# Patient Record
Sex: Female | Born: 1935 | Race: Black or African American | Hispanic: No | Smoking: Never smoker
Health system: Southern US, Community
[De-identification: ages and names within clinical notes are randomized; demographics above are authoritative.]

## PROBLEM LIST (undated history)

## (undated) DIAGNOSIS — E119 Type 2 diabetes mellitus without complications: Secondary | ICD-10-CM

## (undated) DIAGNOSIS — K219 Gastro-esophageal reflux disease without esophagitis: Secondary | ICD-10-CM

## (undated) DIAGNOSIS — C801 Malignant (primary) neoplasm, unspecified: Secondary | ICD-10-CM

## (undated) DIAGNOSIS — Z86718 Personal history of other venous thrombosis and embolism: Secondary | ICD-10-CM

## (undated) DIAGNOSIS — I10 Essential (primary) hypertension: Secondary | ICD-10-CM

## (undated) DIAGNOSIS — M4800 Spinal stenosis, site unspecified: Secondary | ICD-10-CM

## (undated) DIAGNOSIS — K5900 Constipation, unspecified: Secondary | ICD-10-CM

## (undated) HISTORY — PX: ABDOMINAL HYSTERECTOMY: SHX81

---

## 2014-11-25 ENCOUNTER — Encounter (HOSPITAL_COMMUNITY): Payer: Self-pay | Admitting: Emergency Medicine

## 2014-11-25 ENCOUNTER — Emergency Department (HOSPITAL_COMMUNITY)
Admission: EM | Admit: 2014-11-25 | Discharge: 2014-11-26 | Disposition: A | Discharge status: Home / Self Care | Payer: Medicare Other | Attending: Emergency Medicine | Admitting: Emergency Medicine

## 2014-11-25 DIAGNOSIS — Z8719 Personal history of other diseases of the digestive system: Secondary | ICD-10-CM | POA: Insufficient documentation

## 2014-11-25 DIAGNOSIS — Z880 Allergy status to penicillin: Secondary | ICD-10-CM | POA: Diagnosis not present

## 2014-11-25 DIAGNOSIS — Z8739 Personal history of other diseases of the musculoskeletal system and connective tissue: Secondary | ICD-10-CM | POA: Insufficient documentation

## 2014-11-25 DIAGNOSIS — E119 Type 2 diabetes mellitus without complications: Secondary | ICD-10-CM | POA: Insufficient documentation

## 2014-11-25 DIAGNOSIS — Z8542 Personal history of malignant neoplasm of other parts of uterus: Secondary | ICD-10-CM | POA: Insufficient documentation

## 2014-11-25 DIAGNOSIS — Z86718 Personal history of other venous thrombosis and embolism: Secondary | ICD-10-CM | POA: Diagnosis not present

## 2014-11-25 DIAGNOSIS — R040 Epistaxis: Secondary | ICD-10-CM

## 2014-11-25 DIAGNOSIS — I10 Essential (primary) hypertension: Secondary | ICD-10-CM | POA: Diagnosis not present

## 2014-11-25 DIAGNOSIS — R042 Hemoptysis: Secondary | ICD-10-CM | POA: Diagnosis present

## 2014-11-25 HISTORY — DX: Type 2 diabetes mellitus without complications: E11.9

## 2014-11-25 HISTORY — DX: Essential (primary) hypertension: I10

## 2014-11-25 HISTORY — DX: Personal history of other venous thrombosis and embolism: Z86.718

## 2014-11-25 HISTORY — DX: Constipation, unspecified: K59.00

## 2014-11-25 HISTORY — DX: Spinal stenosis, site unspecified: M48.00

## 2014-11-25 HISTORY — DX: Malignant (primary) neoplasm, unspecified: C80.1

## 2014-11-25 HISTORY — DX: Gastro-esophageal reflux disease without esophagitis: K21.9

## 2014-11-25 LAB — CBG MONITORING, ED: Glucose-Capillary: 144 mg/dL — ABNORMAL HIGH (ref 65–99)

## 2014-11-25 NOTE — ED Notes (Signed)
Bed: [id number] Expected date:  Expected time:  Means of arrival:  Comments: EMS 81 yo female from SNF, found patient in room at 2230 coughing up blood, 25 cc's

## 2014-11-25 NOTE — ED Notes (Addendum)
Per EMS, patient from [facility], patient had an episode of coughing up blood per facility staff at 2230. Estimated amount 12ml. Described as clear and red in color with no clots. Patient has no specific complaints. Patient has a DNR. Patient takes 15mg  Xarelto daily.

## 2014-11-26 ENCOUNTER — Emergency Department (HOSPITAL_COMMUNITY): Payer: Medicare Other

## 2014-11-26 ENCOUNTER — Encounter (HOSPITAL_COMMUNITY): Payer: Self-pay

## 2014-11-26 LAB — CBC WITH DIFFERENTIAL/PLATELET
BASOS ABS: 0 10*3/uL (ref 0.0–0.1)
Basophils Relative: 0 %
Eosinophils Absolute: 0.2 10*3/uL (ref 0.0–0.7)
Eosinophils Relative: 3 %
HEMATOCRIT: 31.4 % — AB (ref 36.0–46.0)
HEMOGLOBIN: 10.5 g/dL — AB (ref 12.0–15.0)
LYMPHS ABS: 2.8 10*3/uL (ref 0.7–4.0)
LYMPHS PCT: 32 %
MCH: 32.6 pg (ref 26.0–34.0)
MCHC: 33.4 g/dL (ref 30.0–36.0)
MCV: 97.5 fL (ref 78.0–100.0)
Monocytes Absolute: 0.8 10*3/uL (ref 0.1–1.0)
Monocytes Relative: 9 %
NEUTROS ABS: 4.8 10*3/uL (ref 1.7–7.7)
Neutrophils Relative %: 56 %
Platelets: 208 10*3/uL (ref 150–400)
RBC: 3.22 MIL/uL — AB (ref 3.87–5.11)
RDW: 13.7 % (ref 11.5–15.5)
WBC: 8.7 10*3/uL (ref 4.0–10.5)

## 2014-11-26 LAB — I-STAT CHEM 8, ED
BUN: 18 mg/dL (ref 6–20)
CHLORIDE: 106 mmol/L (ref 101–111)
CREATININE: 0.8 mg/dL (ref 0.44–1.00)
Calcium, Ion: 1.3 mmol/L (ref 1.13–1.30)
GLUCOSE: 147 mg/dL — AB (ref 65–99)
HCT: 33 % — ABNORMAL LOW (ref 36.0–46.0)
Hemoglobin: 11.2 g/dL — ABNORMAL LOW (ref 12.0–15.0)
POTASSIUM: 4.1 mmol/L (ref 3.5–5.1)
Sodium: 143 mmol/L (ref 135–145)
TCO2: 24 mmol/L (ref 0–100)

## 2014-11-26 LAB — PROTIME-INR
INR: 1.88 — ABNORMAL HIGH (ref 0.00–1.49)
Prothrombin Time: 21.5 seconds — ABNORMAL HIGH (ref 11.6–15.2)

## 2014-11-26 MED ORDER — IOHEXOL 350 MG/ML SOLN
100.0000 mL | Freq: Once | INTRAVENOUS | Status: AC | PRN
  Administered 2014-11-26: 100 mL via INTRAVENOUS

## 2014-11-26 NOTE — ED Notes (Signed)
IV attempt x2 unsuccessful. Requested 2nd nurse to assist with placement.

## 2014-11-26 NOTE — Discharge Instructions (Signed)

## 2014-11-26 NOTE — ED Provider Notes (Signed)
CSN: [id number]     Arrival date & time 11/25/14  2333 History   By signing my name below, I, Leslie Maldonado, attest that this documentation has been prepared under the direction and in the presence of Leslie Pickering, MD.  Electronically Signed: Forrestine Maldonado, ED Scribe. 11/26/2014. 1:36 AM.   Chief Complaint  Patient presents with  . Hemoptysis   The history is provided by the patient. No language interpreter was used.    HPI Comments: Leslie Maldonado brought in by EMS from [facility] and Leslie Maldonado is a 81 y.o. female with a PMHx of DM, HTN, and cancer who presents to the Emergency Department here after an episode of hemoptysis at approximately 2230. Estimated amount 25 ml described as clear and red in color without any clots. No previous history of same. She denies any pain at this time. No recent fever, chills, nausea, vomiting, abdominal pain, chest pain, or shortness of breath. Leslie Maldonado takes 15 mg of Xarelto daily. Pt is DNR.  PCP: No primary care provider on file.    Past Medical History  Diagnosis Date  . Spinal stenosis   . Diabetes mellitus without complication ([place])   . Hypertension   . History of DVT (deep vein thrombosis)   . Constipation   . GERD (gastroesophageal reflux disease)   . Cancer [facility])     endometrial   Past Surgical History  Procedure Laterality Date  . Abdominal hysterectomy     No family history on file. Social History  Substance Use Topics  . Smoking status: Never Smoker   . Smokeless tobacco: None  . Alcohol Use: No   OB History    No data available     Review of Systems  Constitutional: Negative for fever and chills.  Respiratory: Positive for cough (coughing up blood). Negative for shortness of breath.   Cardiovascular: Negative for chest pain.  Gastrointestinal: Negative for nausea, vomiting and abdominal pain.  Genitourinary: Negative for dysuria.  Musculoskeletal: Negative for back pain.  Skin: Negative for rash.  Neurological:  Negative for numbness and headaches.  Psychiatric/Behavioral: Negative for confusion.  All other systems reviewed and are negative.     Allergies  Other; Penicillins; and Sulfa antibiotics  Home Medications   Prior to Admission medications   Not on File   Triage Vitals: BP 151/68 mmHg  Pulse 67  Temp(Src) 98.2 F (36.8 C) (Oral)  Resp 16  SpO2 97%   Physical Exam  Constitutional: She is oriented to person, place, and time. She appears well-developed and well-nourished. No distress.  HENT:  Head: Normocephalic and atraumatic.  Mouth/Throat: Oropharynx is clear and moist. No oropharyngeal exudate.  Bloody crusting at the top of the oropharynx   Eyes: EOM are normal. Pupils are equal, round, and reactive to light.  Neck: Normal range of motion.  Cardiovascular: Normal rate, regular rhythm and normal heart sounds.   No murmur heard. Pulmonary/Chest: Effort normal and breath sounds normal. No respiratory distress. She has no wheezes. She has no rales. She exhibits no tenderness.  Abdominal: Soft. Bowel sounds are normal. She exhibits no distension. There is no tenderness.  Musculoskeletal: Normal range of motion.  Neurological: She is alert and oriented to person, place, and time. She has normal reflexes.  Skin: Skin is warm and dry.  Psychiatric: She has a normal mood and affect. Judgment normal.  Nursing note and vitals reviewed.   ED Course  Procedures (including critical care time)  DIAGNOSTIC STUDIES: Oxygen Saturation is  97% on RA, adequate by my interpretation.    COORDINATION OF CARE: 1:29 AM- Will order CT angio chest PE with CM and OR without CM, CXR, CBC, i-stat chem 8, PT-INR, and CBG. Discussed treatment plan with pt at bedside and pt agreed to plan.     Labs Review Labs Reviewed  CBC WITH DIFFERENTIAL/PLATELET - Abnormal; Notable for the following:    RBC 3.22 (*)    Hemoglobin 10.5 (*)    HCT 31.4 (*)    All other components within normal limits   PROTIME-INR - Abnormal; Notable for the following:    Prothrombin Time 21.5 (*)    INR 1.88 (*)    All other components within normal limits  CBG MONITORING, ED - Abnormal; Notable for the following:    Glucose-Capillary 144 (*)    All other components within normal limits  I-STAT CHEM 8, ED - Abnormal; Notable for the following:    Glucose, Bld 147 (*)    Hemoglobin 11.2 (*)    HCT 33.0 (*)    All other components within normal limits    Imaging Review Dg Chest 2 View  11/26/2014  CLINICAL DATA:  Coughing up blood this afternoon. No chest complaints. EXAM: CHEST  2 VIEW COMPARISON:  None. FINDINGS: Shallow inspiration. Normal heart size and pulmonary vascularity. No focal airspace disease or consolidation in the lungs. No blunting of costophrenic angles. No pneumothorax. Mediastinal contours appear intact. Prominent degenerative changes in both shoulders with probable chronic dislocations. Degenerative changes in the spine. Postoperative changes in the lower cervical spine. IMPRESSION: No active cardiopulmonary disease. Electronically Signed   By: Lucienne Capers M.D.   On: 11/26/2014 00:35   I have personally reviewed and evaluated these images and lab results as part of my medical decision-making.   EKG Interpretation None      MDM   Final diagnoses:  None   Based on the location of the crusting.  I suspect patient had a posterior nosebleed that resolved.  No PE or hemorrhage on CT no further bleeding.  Safe for discharge.  Strict return precautions  I personally performed the services described in this documentation, which was scribed in my presence. The recorded information has been reviewed and is accurate.     Leslie Kells, MD 11/26/14 [phone]

## 2014-11-26 NOTE — ED Notes (Signed)
Patient transported to CT 

## 2014-11-26 NOTE — ED Notes (Signed)
Attempted to call report to [facility], no answer.

## 2014-11-26 NOTE — ED Notes (Signed)
Patient returned from CT

## 2014-11-26 NOTE — ED Notes (Signed)
[facility] called for discharge transport 

## 2016-11-05 IMAGING — CT CT ANGIO CHEST
2 of 6 series · 18 of 36 positions shown · IV contrast (omnipaque)
Comparison: Chest radiograph performed earlier today at [DATE] a.m.

CLINICAL DATA: Acute onset of hemoptysis.  Initial encounter.

EXAM:
CT ANGIOGRAPHY CHEST WITH CONTRAST
TECHNIQUE: Multidetector CT imaging of the chest was performed using the
standard protocol during bolus administration of intravenous
contrast. Multiplanar CT image reconstructions and MIPs were
obtained to evaluate the vascular anatomy.
CONTRAST:  100mL OMNIPAQUE IOHEXOL 350 MG/ML SOLN

[Series 6: thins for pacs · axial · 0.62mm/px · z∈[+1392,+1564]mm · 17 of 192 slices shown]
[im 10/192  lung]
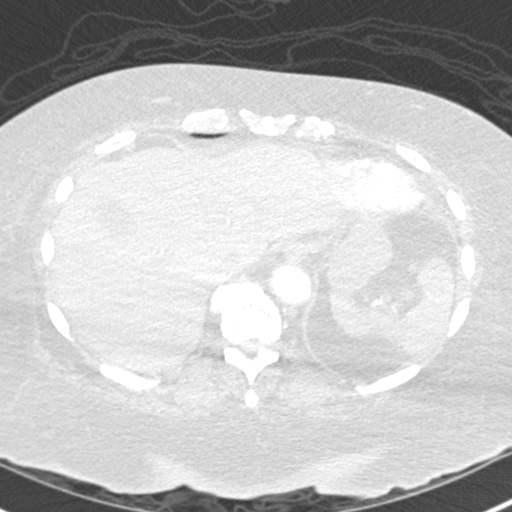
[im 20/192  mediastinal]
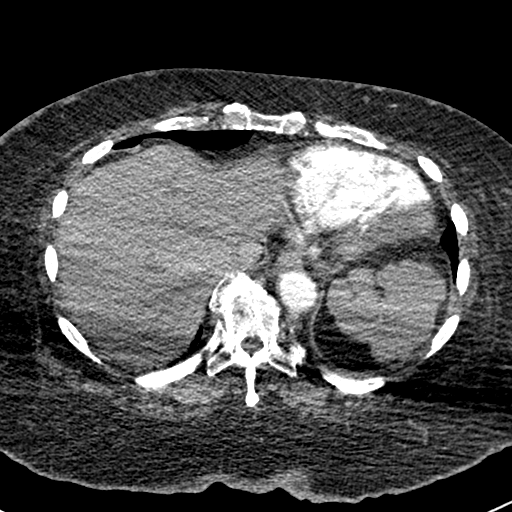
[im 29/192  lung]
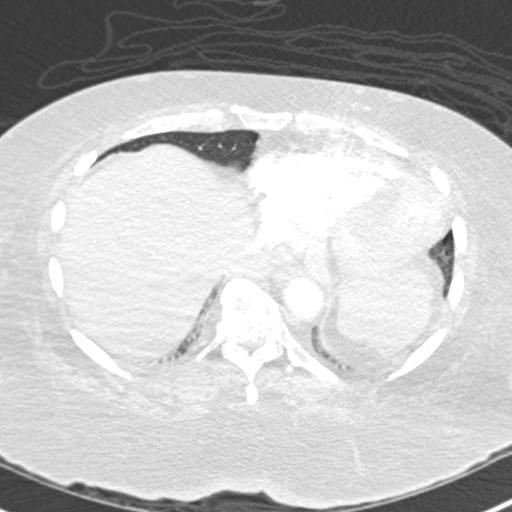
[im 39/192  mediastinal]
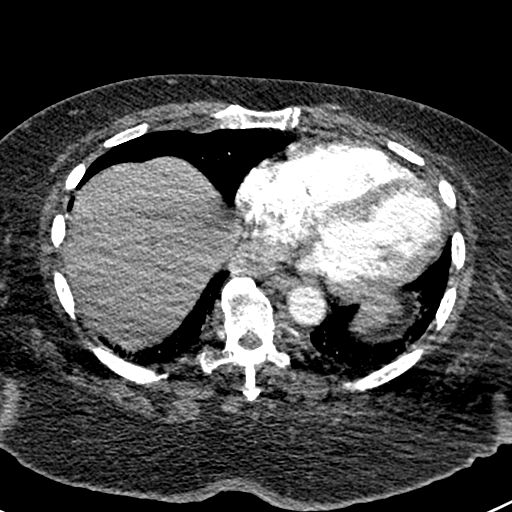
[im 58/192  lung]
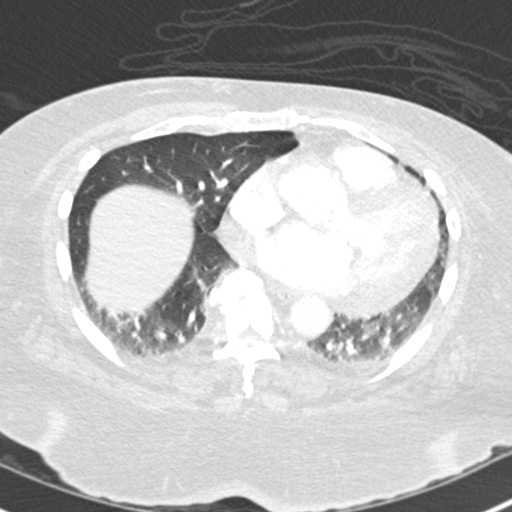
[im 67/192  mediastinal]
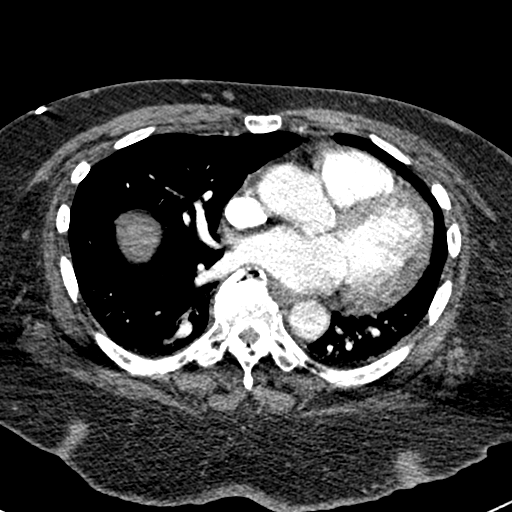
[im 77/192  lung]
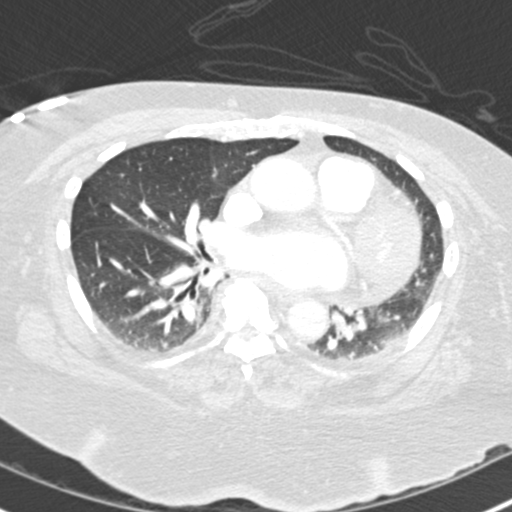
[im 86/192  mediastinal]
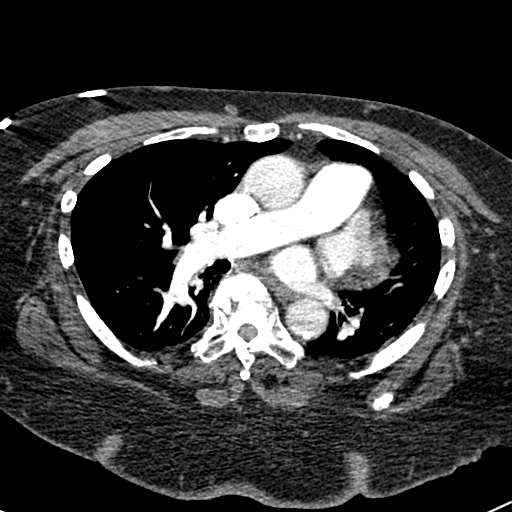
[im 96/192  lung]
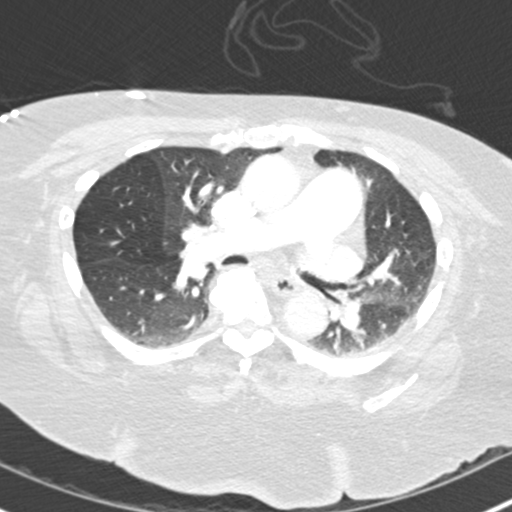
[im 106/192  mediastinal]
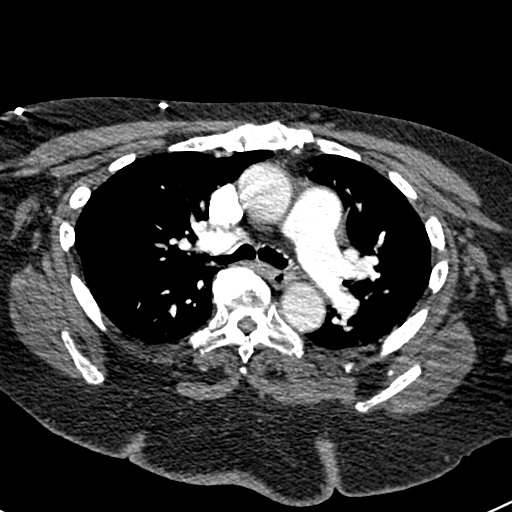
[im 115/192  lung]
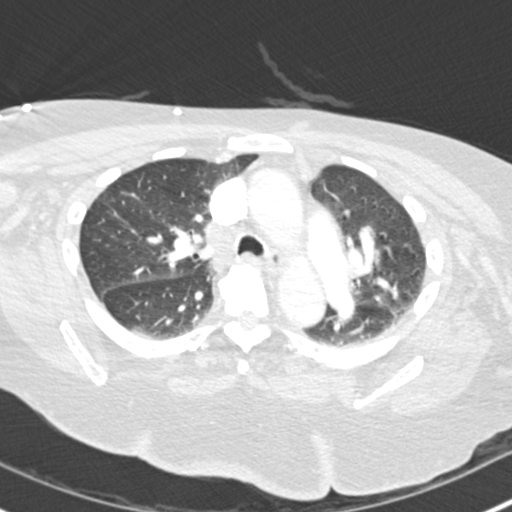
[im 125/192  mediastinal]
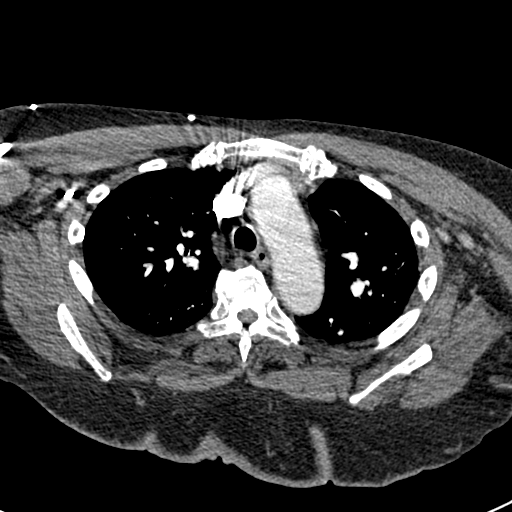
[im 134/192  lung]
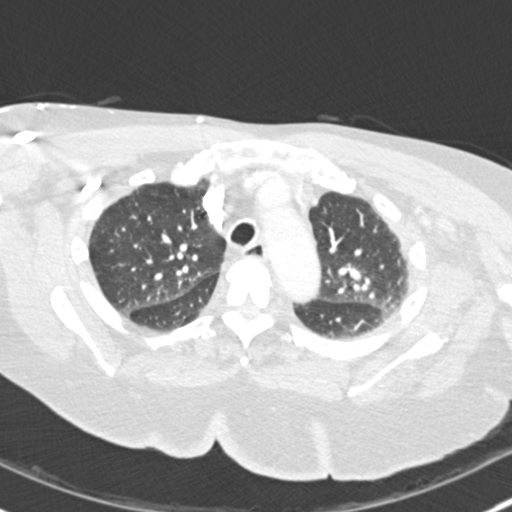
[im 153/192  mediastinal]
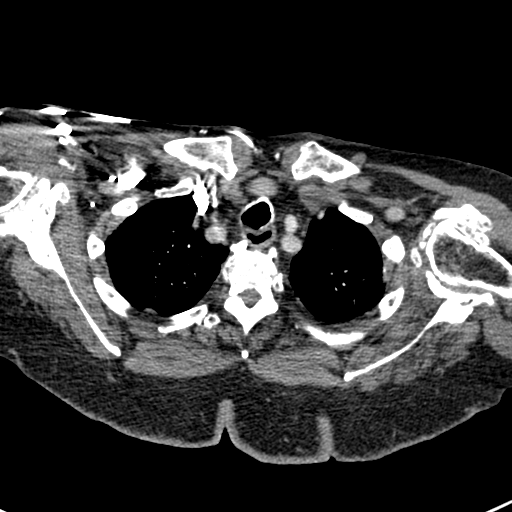
[im 163/192  lung]
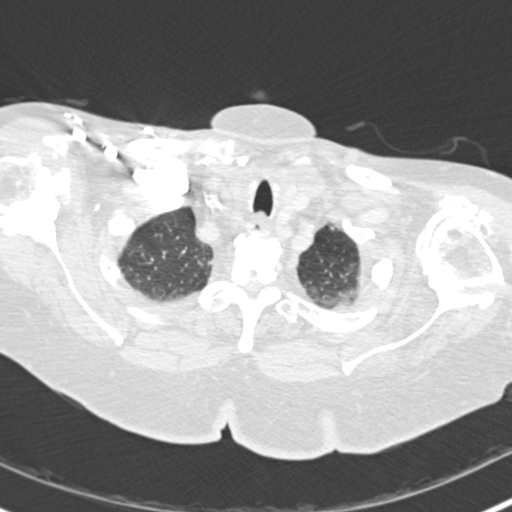
[im 172/192  mediastinal]
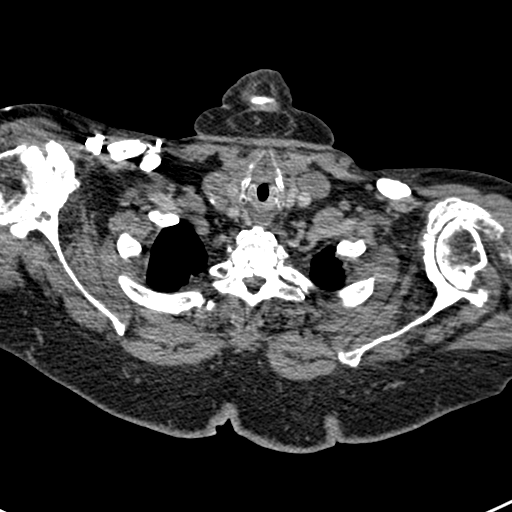
[im 182/192  lung]
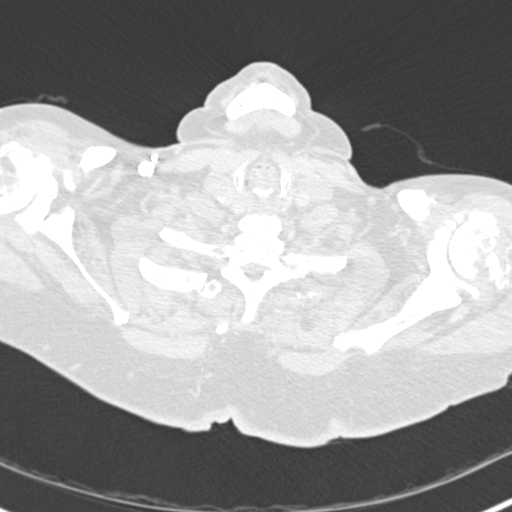

[Series 8: coronal mpr · coronal · 0.37mm/px · 1 of 108 slices shown]
[im 54/108  mediastinal]
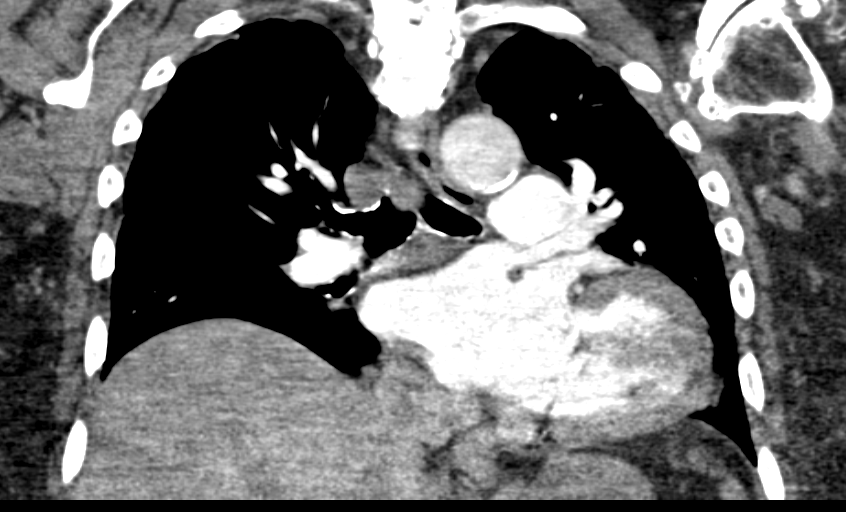

[18 of 36 positions shown; findings below may reference images not displayed]

FINDINGS: There is no evidence of pulmonary embolus.

Minimal dependent bilateral airspace opacities likely reflect
atelectasis. There is no evidence of pleural effusion or
pneumothorax. No masses are identified; no abnormal focal contrast
enhancement is seen.

The mediastinum is unremarkable in appearance. No mediastinal
lymphadenopathy is seen. No pericardial effusion is identified. The
great vessels are grossly unremarkable. No axillary lymphadenopathy
is seen. The visualized portions of the thyroid gland are
unremarkable in appearance.

The visualized portions of the liver are unremarkable. There is a
diffusely nodular appearance to the spleen, likely reflecting
chronic scarring.

No acute osseous abnormalities are seen. There is chronic deformity
of the proximal left humerus, reflecting remote fracture, with
surrounding heterotopic bone formation. There is chronic remodeling
at the right humeral head, with joint space loss and subcortical
cystic change. Diffuse sclerotic change is noted along the thoracic
spine. Anterior osteophytes are noted along the lower cervical and
thoracic spine.

Review of the MIP images confirms the above findings.
IMPRESSION: 1. No evidence of pulmonary embolus.
2. Minimal dependent bilateral airspace opacities likely reflect
atelectasis. Lungs otherwise clear.
3. Nodular appearance to the spleen likely reflects chronic
scarring.
4. Chronic deformity of the proximal left humerus, reflecting remote
fracture, and chronic remodeling at the right humeral head, with
joint space loss and subcortical cystic change.
5. Diffuse nonspecific sclerotic change along the thoracic spine.

## 2018-03-25 ENCOUNTER — Emergency Department (HOSPITAL_COMMUNITY)
Admission: EM | Admit: 2018-03-25 | Discharge: 2018-03-25 | Disposition: A | Discharge status: Home / Self Care | Payer: Medicare Other | Attending: Emergency Medicine | Admitting: Emergency Medicine

## 2018-03-25 ENCOUNTER — Encounter (HOSPITAL_COMMUNITY): Payer: Self-pay

## 2018-03-25 ENCOUNTER — Other Ambulatory Visit: Payer: Self-pay

## 2018-03-25 DIAGNOSIS — R600 Localized edema: Secondary | ICD-10-CM | POA: Diagnosis not present

## 2018-03-25 DIAGNOSIS — I10 Essential (primary) hypertension: Secondary | ICD-10-CM | POA: Insufficient documentation

## 2018-03-25 DIAGNOSIS — E119 Type 2 diabetes mellitus without complications: Secondary | ICD-10-CM | POA: Diagnosis not present

## 2018-03-25 DIAGNOSIS — Z79899 Other long term (current) drug therapy: Secondary | ICD-10-CM | POA: Diagnosis not present

## 2018-03-25 DIAGNOSIS — Z7901 Long term (current) use of anticoagulants: Secondary | ICD-10-CM | POA: Insufficient documentation

## 2018-03-25 DIAGNOSIS — R4182 Altered mental status, unspecified: Secondary | ICD-10-CM | POA: Insufficient documentation

## 2018-03-25 DIAGNOSIS — Z86718 Personal history of other venous thrombosis and embolism: Secondary | ICD-10-CM | POA: Diagnosis not present

## 2018-03-25 DIAGNOSIS — Z8542 Personal history of malignant neoplasm of other parts of uterus: Secondary | ICD-10-CM | POA: Diagnosis not present

## 2018-03-25 DIAGNOSIS — D649 Anemia, unspecified: Secondary | ICD-10-CM

## 2018-03-25 LAB — COMPREHENSIVE METABOLIC PANEL
ALK PHOS: 44 U/L (ref 38–126)
ALT: 13 U/L (ref 0–44)
AST: 19 U/L (ref 15–41)
Albumin: 3 g/dL — ABNORMAL LOW (ref 3.5–5.0)
Anion gap: 5 (ref 5–15)
BILIRUBIN TOTAL: 0.8 mg/dL (ref 0.3–1.2)
BUN: 18 mg/dL (ref 8–23)
CALCIUM: 9 mg/dL (ref 8.9–10.3)
CHLORIDE: 109 mmol/L (ref 98–111)
CO2: 25 mmol/L (ref 22–32)
Creatinine, Ser: 0.92 mg/dL (ref 0.44–1.00)
GFR, EST NON AFRICAN AMERICAN: 56 mL/min — AB (ref >60)
GLUCOSE: 108 mg/dL — AB (ref 70–99)
Potassium: 3.9 mmol/L (ref 3.5–5.1)
Sodium: 139 mmol/L (ref 135–145)
Total Protein: 6.9 g/dL (ref 6.5–8.1)

## 2018-03-25 LAB — CBC WITH DIFFERENTIAL/PLATELET
ABS IMMATURE GRANULOCYTES: 0.04 10*3/uL (ref 0.00–0.07)
BASOS ABS: 0 10*3/uL (ref 0.0–0.1)
Basophils Relative: 1 %
Eosinophils Absolute: 0.3 10*3/uL (ref 0.0–0.5)
Eosinophils Relative: 4 %
HEMATOCRIT: 30 % — AB (ref 36.0–46.0)
HEMOGLOBIN: 9.8 g/dL — AB (ref 12.0–15.0)
IMMATURE GRANULOCYTES: 1 %
LYMPHS ABS: 2.4 10*3/uL (ref 0.7–4.0)
LYMPHS PCT: 29 %
MCH: 32.5 pg (ref 26.0–34.0)
MCHC: 32.7 g/dL (ref 30.0–36.0)
MCV: 99.3 fL (ref 80.0–100.0)
MONOS PCT: 12 %
Monocytes Absolute: 0.9 10*3/uL (ref 0.1–1.0)
NEUTROS PCT: 53 %
NRBC: 0 % (ref 0.0–0.2)
Neutro Abs: 4.4 10*3/uL (ref 1.7–7.7)
Platelets: 207 10*3/uL (ref 150–400)
RBC: 3.02 MIL/uL — ABNORMAL LOW (ref 3.87–5.11)
RDW: 13.7 % (ref 11.5–15.5)
WBC: 8.1 10*3/uL (ref 4.0–10.5)

## 2018-03-25 LAB — CBG MONITORING, ED: Glucose-Capillary: 96 mg/dL (ref 70–99)

## 2018-03-25 LAB — URINALYSIS, ROUTINE W REFLEX MICROSCOPIC
Bilirubin Urine: NEGATIVE
GLUCOSE, UA: NEGATIVE mg/dL
Hgb urine dipstick: NEGATIVE
Ketones, ur: NEGATIVE mg/dL
LEUKOCYTE UA: NEGATIVE
NITRITE: NEGATIVE
PH: 7.5 (ref 5.0–8.0)
Protein, ur: NEGATIVE mg/dL
SPECIFIC GRAVITY, URINE: 1.01 (ref 1.005–1.030)

## 2018-03-25 NOTE — ED Notes (Signed)
Called [facility] for transport back to [facility]

## 2018-03-25 NOTE — Discharge Instructions (Signed)
Turn to the emergency department for severe or worsening symptoms including changes in mental status, weakness, vomiting, fevers.  Your testing today has been normal except for mild anemia.  Your doctor can follow this up in 1 week.

## 2018-03-25 NOTE — ED Triage Notes (Signed)
EMS reported pt was unresponsive when they arrived to [facility], she was not responding to voice, stimuli or pain. Facility reports she was found this way at [place] when she was approached to be given a bath. Pt arrived to facility awake & alert, EMS reports she gradually became more awake as they were on the way to the ED.

## 2018-03-25 NOTE — ED Notes (Signed)
Pt's granddaughter Freddi Starr called & spoke to pt on the phone.

## 2018-03-25 NOTE — ED Notes (Signed)
Clemens Catholic, Practitioner from [facility] called to check on pt, She left her number if needed until 1300 [phone], the "on call" team after 1300 number is [phone].

## 2018-03-25 NOTE — ED Notes (Signed)
EDP at bedside  

## 2018-03-25 NOTE — ED Notes (Signed)
Place purwick on pt. 

## 2018-03-25 NOTE — ED Provider Notes (Addendum)
[facility] EMERGENCY DEPARTMENT Provider Note   CSN: [id number] Arrival date & time: 03/25/18  4287    History   Chief Complaint No chief complaint on file.   HPI Leslie Maldonado is a 85 y.o. female.     HPI  The patient is a 85 year old female, she has a known history of acid reflux, DVT and high blood pressure as well as diabetes.  She is known to have endometrial cancer in the past.  She is currently living at [facility] facility and presents today after having altered mental status.  Reportedly when staff went in to give the patient a bedside bath she was difficult to arouse including to both pain and physical stimulus.  When the paramedics arrived she had a improved mental status and in route to the hospital return back to her baseline.  There is no history of fevers coughing vomiting diarrhea and the patient is unable to tell me why she is here.  She has no complaints at this time including headache neck pain numbness weakness difficulty with urination (in an adult diaper), she does have swelling in her legs which is chronic.  There was no seizure activity seen.  Her blood sugar prehospital was just over 100.  Past Medical History:  Diagnosis Date  . Cancer (HCC)    endometrial  . Constipation   . Diabetes mellitus without complication ([place])   . GERD (gastroesophageal reflux disease)   . History of DVT (deep vein thrombosis)   . Hypertension   . Spinal stenosis     There are no active problems to display for this patient.   Past Surgical History:  Procedure Laterality Date  . ABDOMINAL HYSTERECTOMY       OB History   No obstetric history on file.      Home Medications    Prior to Admission medications   Medication Sig Start Date End Date Taking? Authorizing Provider  acetaminophen (TYLENOL) 325 MG tablet Take 650 mg by mouth 3 (three) times daily as needed for mild pain.    [provider]  Amino Acids-Protein Hydrolys (FEEDING  SUPPLEMENT, PRO-STAT SUGAR FREE 64,) LIQD Take 30 mLs by mouth 2 (two) times daily.    [provider]  ascorbic acid (VITAMIN C) 500 MG tablet Take 500 mg by mouth daily.    [provider]  calcium-vitamin D (OSCAL WITH D) 500-200 MG-UNIT tablet Take 1 tablet by mouth 2 (two) times daily.    [provider]  cholecalciferol (VITAMIN D) 1000 UNITS tablet Take 1,000 Units by mouth daily.    [provider]  gabapentin (NEURONTIN) 100 MG capsule Take 100 mg by mouth at bedtime.    [provider]  lisinopril (PRINIVIL,ZESTRIL) 5 MG tablet Take 5 mg by mouth daily.    [provider]  polyvinyl alcohol (LIQUIFILM TEARS) 1.4 % ophthalmic solution Place 1 drop into both eyes 2 (two) times daily.    [provider]  QUEtiapine (SEROQUEL) 50 MG tablet Take 50 mg by mouth 2 (two) times daily.    [provider]  ranitidine (ZANTAC) 150 MG tablet Take 150 mg by mouth at bedtime.    [provider]  Rivaroxaban (XARELTO) 15 MG TABS tablet Take 15 mg by mouth every evening.    [provider]  senna-docusate (SENOKOT-S) 8.6-50 MG tablet Take 2 tablets by mouth 2 (two) times daily.    [provider]  sodium chloride (OCEAN) 0.65 % SOLN nasal  spray Place 1 spray into both nostrils at bedtime as needed for congestion.    [provider]    Family History History reviewed. No pertinent family history.  Social History Social History   Tobacco Use  . Smoking status: Never Smoker  . Smokeless tobacco: Never Used  Substance Use Topics  . Alcohol use: No  . Drug use: No     Allergies   Other; Penicillins; and Sulfa antibiotics   Review of Systems Review of Systems  All other systems reviewed and are negative.    Physical Exam Updated Vital Signs SpO2 100%   Physical Exam Vitals signs and nursing note reviewed.  Constitutional:      General: She is not in acute distress.     Appearance: She is well-developed.  HENT:     Head: Normocephalic and atraumatic.     Mouth/Throat:     Pharynx: No oropharyngeal exudate.  Eyes:     General: No scleral icterus.       Right eye: No discharge.        Left eye: No discharge.     Conjunctiva/sclera: Conjunctivae normal.     Pupils: Pupils are equal, round, and reactive to light.  Neck:     Musculoskeletal: Normal range of motion and neck supple.     Thyroid: No thyromegaly.     Vascular: No JVD.  Cardiovascular:     Rate and Rhythm: Normal rate and regular rhythm.     Heart sounds: Normal heart sounds. No murmur. No friction rub. No gallop.   Pulmonary:     Effort: Pulmonary effort is normal. No respiratory distress.     Breath sounds: Normal breath sounds. No wheezing or rales.  Abdominal:     General: Bowel sounds are normal. There is no distension.     Palpations: Abdomen is soft. There is no mass.     Tenderness: There is no abdominal tenderness.  Musculoskeletal: Normal range of motion.        General: No tenderness.     Right lower leg: Edema present.     Left lower leg: Edema present.  Lymphadenopathy:     Cervical: No cervical adenopathy.  Skin:    General: Skin is warm and dry.     Findings: No erythema or rash.  Neurological:     Mental Status: She is alert.     Coordination: Coordination normal.     Comments: The patient is edentulous, she is able to follow commands, she is able to lean forward, she has moving her head from side to side opening and closing her mouth opening or closing her eyes all to command.  She has weakness of her bilateral lower extremities.  Psychiatric:        Behavior: Behavior normal.      ED Treatments / Results  Labs (all labs ordered are listed, but only abnormal results are displayed) Labs Reviewed - No data to display  EKG None  Radiology No results found.  Procedures Procedures (including critical care time)  Medications Ordered in ED Medications - No  data to display   Initial Impression / Assessment and Plan / ED Course  I have reviewed the triage vital signs and the nursing notes.  Pertinent labs & imaging results that were available during my care of the patient were reviewed by me and considered in my medical decision making (see chart for details).  Clinical Course as of Mar 25 1218  Sun Mar 25, 2018  4503 Metabolic panel is unremarkable, CBC is also unremarkable, urinalysis is clean and the patient has been observed now for the last several hours without any decline or recurrent altered mental status.  There is been no seizures, no complaints of numbness weakness or any other symptoms.  She is stable for discharge   [BM]    Clinical Course User Index [BM] Noemi Chapel, MD       Heart rate is 60, blood pressure is unremarkable, the patient has no respiratory findings, no neurologic findings of concern and at this time with a totally normal mental status without a headache I doubt that there is any significant concern from a neurologic standpoint.  It is unclear exactly what caused her brief event however there was no seizure activity, no focal neurologic findings either at the time or now, no witnessed seizure activity, no recent infectious symptoms and on exam there is no findings of concern.  She is anticoagulated for her DVTs already, she has not had anything to eat.  We will obtain a urine sample, some basic labs and an EKG.  I called the nursing and rehabilitation facility, and according to the nurse from the facility the patient is a paraplegic, she does not walk, she is bedbound.  She reports that her baseline mental status is slightly confused, she is able to speak but has some difficulty speaking because of her tongue and her mouth, this is exactly what we are seeing here.  She reports that she has known her for 2 months.  I have reviewed the patient's labs, her hemoglobin is 9.8, it was 1 g higher 4 years ago, her metabolic  panel is unremarkable with normal renal function, glucose is normal the urinalysis is being obtained at this time.  The EKG was reassuring, I anticipate discharge back after urinalysis returned.  Final Clinical Impressions(s) / ED Diagnoses   Final diagnoses:  Altered mental status, unspecified altered mental status type  Anemia, unspecified type    ED Discharge Orders    None       Noemi Chapel, MD 03/25/18 1040    Noemi Chapel, MD 03/25/18 [phone]

## 2018-03-26 LAB — URINE CULTURE: CULTURE: NO GROWTH

## 2018-09-04 DEATH — deceased
